# Patient Record
Sex: Male | Born: 2000 | Race: Black or African American | Hispanic: No | Marital: Single | Smoking: Never smoker
Health system: Southern US, Community
[De-identification: ages and names within clinical notes are randomized; demographics above are authoritative.]

## PROBLEM LIST (undated history)

## (undated) HISTORY — PX: OTHER SURGICAL HISTORY: SHX169

---
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
## 2022-03-07 ENCOUNTER — Other Ambulatory Visit: Payer: Self-pay

## 2022-03-07 ENCOUNTER — Emergency Department: Payer: Medicaid Other

## 2022-03-07 ENCOUNTER — Emergency Department
Admission: EM | Admit: 2022-03-07 | Discharge: 2022-03-07 | Discharge status: Against Medical Advice | Payer: Medicaid Other | Attending: Emergency Medicine | Admitting: Emergency Medicine

## 2022-03-07 DIAGNOSIS — Z5329 Procedure and treatment not carried out because of patient's decision for other reasons: Secondary | ICD-10-CM | POA: Diagnosis not present

## 2022-03-07 DIAGNOSIS — R9431 Abnormal electrocardiogram [ECG] [EKG]: Secondary | ICD-10-CM | POA: Diagnosis not present

## 2022-03-07 DIAGNOSIS — R079 Chest pain, unspecified: Secondary | ICD-10-CM

## 2022-03-07 DIAGNOSIS — R0602 Shortness of breath: Secondary | ICD-10-CM | POA: Diagnosis not present

## 2022-03-07 DIAGNOSIS — R001 Bradycardia, unspecified: Secondary | ICD-10-CM | POA: Diagnosis not present

## 2022-03-07 NOTE — ED Notes (Signed)
Patient refused to have labs drawn. 

## 2022-03-07 NOTE — Discharge Instructions (Signed)
You are leaving AGAINST MEDICAL ADVICE.  You have an abnormal EKG and I am concerned you might be having a heart attack or heart damage.  You refused to have blood work done in order to be evaluated to see if you are having a heart attack.  If you are having a heart attack, you may suffer permanent disability or death.  Please return to the ER if you experience additional pain, any concerns or if you change your mind about getting blood work.

## 2022-03-07 NOTE — ED Triage Notes (Signed)
Patient reports chest pain which began two hours ago when he was smoking marijuana. Patient reports mild shortness of breath states he believes he has asthma. AOX4. Resp even, unlabored on RA. Ambulatory.
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
## 2022-03-07 NOTE — ED Notes (Signed)
Patient refusing blood work at this time with MD and RN  in room.
